# Patient Record
Sex: Female | Born: 1960 | Race: White | Hispanic: No | Marital: Married | Smoking: Former smoker
Health system: Southern US, Community
[De-identification: ages and names within clinical notes are randomized; demographics above are authoritative.]

## PROBLEM LIST (undated history)

## (undated) DIAGNOSIS — M069 Rheumatoid arthritis, unspecified: Secondary | ICD-10-CM

## (undated) DIAGNOSIS — K219 Gastro-esophageal reflux disease without esophagitis: Secondary | ICD-10-CM

## (undated) DIAGNOSIS — K589 Irritable bowel syndrome without diarrhea: Secondary | ICD-10-CM

## (undated) DIAGNOSIS — R42 Dizziness and giddiness: Secondary | ICD-10-CM

## (undated) DIAGNOSIS — IMO0001 Reserved for inherently not codable concepts without codable children: Secondary | ICD-10-CM

## (undated) DIAGNOSIS — M3500 Sicca syndrome, unspecified: Secondary | ICD-10-CM

## (undated) HISTORY — DX: Sjogren syndrome, unspecified: M35.00

## (undated) HISTORY — DX: Rheumatoid arthritis, unspecified: M06.9

---

## 1999-06-05 DIAGNOSIS — R42 Dizziness and giddiness: Secondary | ICD-10-CM

## 1999-06-05 HISTORY — DX: Dizziness and giddiness: R42

## 2000-06-04 HISTORY — PX: BLADDER SUSPENSION: SHX72

## 2014-12-31 ENCOUNTER — Encounter (HOSPITAL_BASED_OUTPATIENT_CLINIC_OR_DEPARTMENT_OTHER): Payer: Self-pay | Admitting: *Deleted

## 2014-12-31 ENCOUNTER — Emergency Department (HOSPITAL_BASED_OUTPATIENT_CLINIC_OR_DEPARTMENT_OTHER)
Admission: EM | Admit: 2014-12-31 | Discharge: 2014-12-31 | Disposition: A | Discharge status: Home / Self Care | Payer: BLUE CROSS/BLUE SHIELD | Attending: Emergency Medicine | Admitting: Emergency Medicine

## 2014-12-31 ENCOUNTER — Emergency Department (HOSPITAL_BASED_OUTPATIENT_CLINIC_OR_DEPARTMENT_OTHER): Payer: BLUE CROSS/BLUE SHIELD

## 2014-12-31 DIAGNOSIS — Z79899 Other long term (current) drug therapy: Secondary | ICD-10-CM | POA: Insufficient documentation

## 2014-12-31 DIAGNOSIS — K088 Other specified disorders of teeth and supporting structures: Secondary | ICD-10-CM | POA: Diagnosis not present

## 2014-12-31 DIAGNOSIS — K219 Gastro-esophageal reflux disease without esophagitis: Secondary | ICD-10-CM | POA: Diagnosis not present

## 2014-12-31 DIAGNOSIS — R1013 Epigastric pain: Secondary | ICD-10-CM | POA: Diagnosis not present

## 2014-12-31 DIAGNOSIS — Z87891 Personal history of nicotine dependence: Secondary | ICD-10-CM | POA: Insufficient documentation

## 2014-12-31 DIAGNOSIS — R140 Abdominal distension (gaseous): Secondary | ICD-10-CM | POA: Insufficient documentation

## 2014-12-31 DIAGNOSIS — Z7952 Long term (current) use of systemic steroids: Secondary | ICD-10-CM | POA: Diagnosis not present

## 2014-12-31 DIAGNOSIS — R0789 Other chest pain: Secondary | ICD-10-CM | POA: Diagnosis not present

## 2014-12-31 DIAGNOSIS — K589 Irritable bowel syndrome without diarrhea: Secondary | ICD-10-CM | POA: Insufficient documentation

## 2014-12-31 DIAGNOSIS — R079 Chest pain, unspecified: Secondary | ICD-10-CM | POA: Diagnosis present

## 2014-12-31 DIAGNOSIS — R51 Headache: Secondary | ICD-10-CM | POA: Diagnosis not present

## 2014-12-31 DIAGNOSIS — R519 Headache, unspecified: Secondary | ICD-10-CM

## 2014-12-31 HISTORY — DX: Dizziness and giddiness: R42

## 2014-12-31 HISTORY — DX: Reserved for inherently not codable concepts without codable children: IMO0001

## 2014-12-31 HISTORY — DX: Gastro-esophageal reflux disease without esophagitis: K21.9

## 2014-12-31 HISTORY — DX: Irritable bowel syndrome without diarrhea: K58.9

## 2014-12-31 LAB — CBC WITH DIFFERENTIAL/PLATELET
BASOS ABS: 0 10*3/uL (ref 0.0–0.1)
BASOS PCT: 1 % (ref 0–1)
Eosinophils Absolute: 0.1 10*3/uL (ref 0.0–0.7)
Eosinophils Relative: 1 % (ref 0–5)
HEMATOCRIT: 41.1 % (ref 36.0–46.0)
Hemoglobin: 13.7 g/dL (ref 12.0–15.0)
Lymphocytes Relative: 48 % — ABNORMAL HIGH (ref 12–46)
Lymphs Abs: 2.9 10*3/uL (ref 0.7–4.0)
MCH: 28.5 pg (ref 26.0–34.0)
MCHC: 33.3 g/dL (ref 30.0–36.0)
MCV: 85.4 fL (ref 78.0–100.0)
Monocytes Absolute: 0.4 10*3/uL (ref 0.1–1.0)
Monocytes Relative: 7 % (ref 3–12)
Neutro Abs: 2.6 10*3/uL (ref 1.7–7.7)
Neutrophils Relative %: 43 % (ref 43–77)
PLATELETS: 239 10*3/uL (ref 150–400)
RBC: 4.81 MIL/uL (ref 3.87–5.11)
RDW: 12.2 % (ref 11.5–15.5)
WBC: 6 10*3/uL (ref 4.0–10.5)

## 2014-12-31 LAB — TROPONIN I: Troponin I: <0.03 ng/mL (ref <0.031)

## 2014-12-31 LAB — COMPREHENSIVE METABOLIC PANEL
ALBUMIN: 4.1 g/dL (ref 3.5–5.0)
ALT: 40 U/L (ref 14–54)
ANION GAP: 7 (ref 5–15)
AST: 37 U/L (ref 15–41)
Alkaline Phosphatase: 78 U/L (ref 38–126)
BILIRUBIN TOTAL: 0.9 mg/dL (ref 0.3–1.2)
BUN: 14 mg/dL (ref 6–20)
CHLORIDE: 103 mmol/L (ref 101–111)
CO2: 26 mmol/L (ref 22–32)
Calcium: 9.1 mg/dL (ref 8.9–10.3)
Creatinine, Ser: 0.76 mg/dL (ref 0.44–1.00)
GFR calc Af Amer: >60 mL/min (ref >60)
GFR calc non Af Amer: >60 mL/min (ref >60)
Glucose, Bld: 97 mg/dL (ref 65–99)
Potassium: 3.7 mmol/L (ref 3.5–5.1)
SODIUM: 136 mmol/L (ref 135–145)
TOTAL PROTEIN: 7.2 g/dL (ref 6.5–8.1)

## 2014-12-31 MED ORDER — DIPHENHYDRAMINE HCL 50 MG/ML IJ SOLN
25.0000 mg | Freq: Once | INTRAMUSCULAR | Status: AC
  Administered 2014-12-31: 25 mg via INTRAVENOUS
  Filled 2014-12-31: qty 1

## 2014-12-31 MED ORDER — KETOROLAC TROMETHAMINE 30 MG/ML IJ SOLN
15.0000 mg | Freq: Once | INTRAMUSCULAR | Status: AC
  Administered 2014-12-31: 15 mg via INTRAVENOUS
  Filled 2014-12-31: qty 1

## 2014-12-31 MED ORDER — DIAZEPAM 5 MG/ML IJ SOLN
5.0000 mg | Freq: Once | INTRAMUSCULAR | Status: AC
  Administered 2014-12-31: 5 mg via INTRAVENOUS
  Filled 2014-12-31: qty 2

## 2014-12-31 MED ORDER — SODIUM CHLORIDE 0.9 % IV BOLUS (SEPSIS)
1000.0000 mL | Freq: Once | INTRAVENOUS | Status: AC
  Administered 2014-12-31: 1000 mL via INTRAVENOUS

## 2014-12-31 MED ORDER — PROCHLORPERAZINE EDISYLATE 5 MG/ML IJ SOLN
10.0000 mg | Freq: Once | INTRAMUSCULAR | Status: AC
  Administered 2014-12-31: 10 mg via INTRAVENOUS
  Filled 2014-12-31: qty 2

## 2014-12-31 NOTE — ED Notes (Signed)
Patient states she was doing laundry about 30 minutes PTA, when she developed a sudden chest pain in the central chest with pain her teeth.  States she has had vertigo this week, seen by her PCP and treated with a steroid injection and po meclizine.  Vertigo improved yesterday, and today had a small amount of dizziness.

## 2014-12-31 NOTE — ED Notes (Signed)
Patient transported to X-ray 

## 2014-12-31 NOTE — ED Provider Notes (Signed)
CSN: [id number]     Arrival date & time 12/31/14  1052 History   First MD Initiated Contact with Patient 12/31/14 1204     Chief Complaint  Patient presents with  . Chest Pain     (Consider location/radiation/quality/duration/timing/severity/associated sxs/prior Treatment) Patient is a 54 y.o. female presenting with chest pain. The history is provided by the patient and a relative.  Chest Pain Pain location:  Epigastric Pain quality: aching and pressure   Pain radiates to:  Does not radiate Pain radiates to the back: no   Pain severity:  Moderate Onset quality:  Sudden Duration:  20 minutes Timing:  Intermittent Progression:  Waxing and waning Chronicity:  New Relieved by: walking. Worsened by:  Nothing tried Ineffective treatments:  None tried Associated symptoms: no dizziness, no fever, no headache, no nausea, no palpitations, no shortness of breath and not vomiting   Risk factors: no coronary artery disease, no diabetes mellitus, no high cholesterol and no hypertension     Patient is a 54 y.o. female who presents with chest pain.  This started 2 hours ago.  The patient was hanging up laundry and felt this pressure about her chest. This is epigastric pain. Resolved on its own after about 20 minutes. Had recurrence about 30 minutes ago. Has completely resolved now is complaining of a right-sided temporal headache. This feels like her prior migraines. Denies nausea diaphoresis shortness of breath. Denies history of smoking hypertension hyperlipidemia. No noted family history. Denies cough however has been having chronic sinus issues for many years. Also complaining that the pain is in her teeth.  Past Medical History  Diagnosis Date  . IBS (irritable bowel syndrome)   . Reflux   . Vertigo 2001   Past Surgical History  Procedure Laterality Date  . Bladder suspension  2002   No family history on file. History  Substance Use Topics  . Smoking status: Former Games developer  .  Smokeless tobacco: Never Used  . Alcohol Use: Yes     Comment: occassionally   OB History    No data available     Review of Systems  Constitutional: Negative for fever and chills.  HENT: Negative for congestion and rhinorrhea.        Teeth  pain  Eyes: Negative for redness and visual disturbance.  Respiratory: Negative for shortness of breath and wheezing.   Cardiovascular: Positive for chest pain. Negative for palpitations.  Gastrointestinal: Negative for nausea and vomiting.  Genitourinary: Negative for dysuria and urgency.  Musculoskeletal: Negative for myalgias and arthralgias.  Skin: Negative for pallor and wound.  Neurological: Negative for dizziness and headaches.      Allergies  Levaquin  Home Medications   Prior to Admission medications   Medication Sig Start Date End Date Taking? Authorizing Provider  butalbital-acetaminophen-caffeine (FIORICET WITH CODEINE) 50-325-40-30 MG per capsule Take 1 capsule by mouth every 4 (four) hours as needed for headache.   Yes Historical Provider, MD  DICYCLOMINE HCL PO Take by mouth.   Yes Historical Provider, MD  Esomeprazole Magnesium (NEXIUM PO) Take by mouth.   Yes Historical Provider, MD  GABAPENTIN, ONCE-DAILY, PO Take by mouth.   Yes Historical Provider, MD  MECLIZINE HCL PO Take by mouth.   Yes Historical Provider, MD  metaxalone (SKELAXIN) 800 MG tablet Take 800 mg by mouth 3 (three) times daily.   Yes Historical Provider, MD  ONDANSETRON HCL PO Take by mouth.   Yes Historical Provider, MD  triamcinolone cream (KENALOG) 0.1 % Apply  1 application topically 2 (two) times daily.   Yes Historical Provider, MD  VALACYCLOVIR HCL PO Take by mouth.   Yes Historical Provider, MD   BP 123/66 mmHg  Pulse 68  Temp(Src) 97.9 F (36.6 C) (Oral)  Resp 16  Ht 5\' 7"  (1.702 m)  Wt 159 lb (72.122 kg)  BMI 24.90 kg/m2  SpO2 99% Physical Exam  Constitutional: She is oriented to person, place, and time. She appears well-developed and  well-nourished. No distress.  HENT:  Head: Normocephalic and atraumatic.  Eyes: EOM are normal. Pupils are equal, round, and reactive to light.  Neck: Normal range of motion. Neck supple.  Cardiovascular: Normal rate and regular rhythm.  Exam reveals no gallop and no friction rub.   No murmur heard. Pulmonary/Chest: Effort normal. She has no wheezes. She has no rales. She exhibits tenderness (TTP about the sternum).  Abdominal: Soft. She exhibits distension (epigastric). There is no tenderness. There is no rebound.  Musculoskeletal: She exhibits no edema or tenderness.  Neurological: She is alert and oriented to person, place, and time.  Skin: Skin is warm and dry. She is not diaphoretic.  Psychiatric: She has a normal mood and affect. Her behavior is normal.    ED Course  Procedures (including critical care time) Labs Review Labs Reviewed  CBC WITH DIFFERENTIAL/PLATELET - Abnormal; Notable for the following:    Lymphocytes Relative 48 (*)    All other components within normal limits  COMPREHENSIVE METABOLIC PANEL  TROPONIN I  TROPONIN I    Imaging Review Dg Chest 2 View  12/31/2014   CLINICAL DATA:  Acute chest pain.  EXAM: CHEST  2 VIEW  COMPARISON:  None.  FINDINGS: The heart size and mediastinal contours are within normal limits. Both lungs are clear. No pneumothorax or pleural effusion is noted. The visualized skeletal structures are unremarkable.  IMPRESSION: No active cardiopulmonary disease.   Electronically Signed   By: Lupita Raider, M.D.   On: 12/31/2014 11:44     EKG Interpretation None      MDM   Final diagnoses:  Chest pain, unspecified chest pain type  Temporal headache    54 yo F with a chief complaint of chest pain. This is now resolved and now she's complaining of a right-sided headache. No history of hypertension feel that an aortic dissection is unlikely. Headache is been ongoing for the past couple weeks unlikely to be subarachnoid hemorrhage. We'll  treat with a migraine cocktail. Delta troponin. Patient low risk for cardiac chest pain. HEAR 1.  PERC Negative.  Delta troponin negative. Patient's headache completely resolved with migraine cocktail. No continued vertiginous symptoms. Will have follow-up with their PCP.   I have discussed the diagnosis/risks/treatment options with the patient and family and believe the pt to be eligible for discharge home to follow-up with PCP. We also discussed returning to the ED immediately if new or worsening sx occur. We discussed the sx which are most concerning (e.g., sudden worsening pain, syncope, sob) that necessitate immediate return. Medications administered to the patient during their visit and any new prescriptions provided to the patient are listed below.  Medications given during this visit Medications  prochlorperazine (COMPAZINE) injection 10 mg (10 mg Intravenous Given 12/31/14 1259)  diphenhydrAMINE (BENADRYL) injection 25 mg (25 mg Intravenous Given 12/31/14 1248)  diazepam (VALIUM) injection 5 mg (5 mg Intravenous Given 12/31/14 1256)  sodium chloride 0.9 % bolus 1,000 mL (0 mLs Intravenous Stopped 12/31/14 1322)  ketorolac (TORADOL) 30 MG/ML  injection 15 mg (15 mg Intravenous Given 12/31/14 1252)    Discharge Medication List as of 12/31/2014  3:21 PM       The patient appears reasonably screen and/or stabilized for discharge and I doubt any other medical condition or other [facility] requiring further screening, evaluation, or treatment in the ED at this time prior to discharge.      Melene Plan, DO 12/31/14 2119

## 2014-12-31 NOTE — Discharge Instructions (Signed)

## 2015-11-28 IMAGING — CR DG CHEST 2V
2 series · 2 of 2 positions shown · non-contrast
Comparison: None.

CLINICAL DATA: Acute chest pain.

EXAM:
CHEST  2 VIEW

[w chest pa]
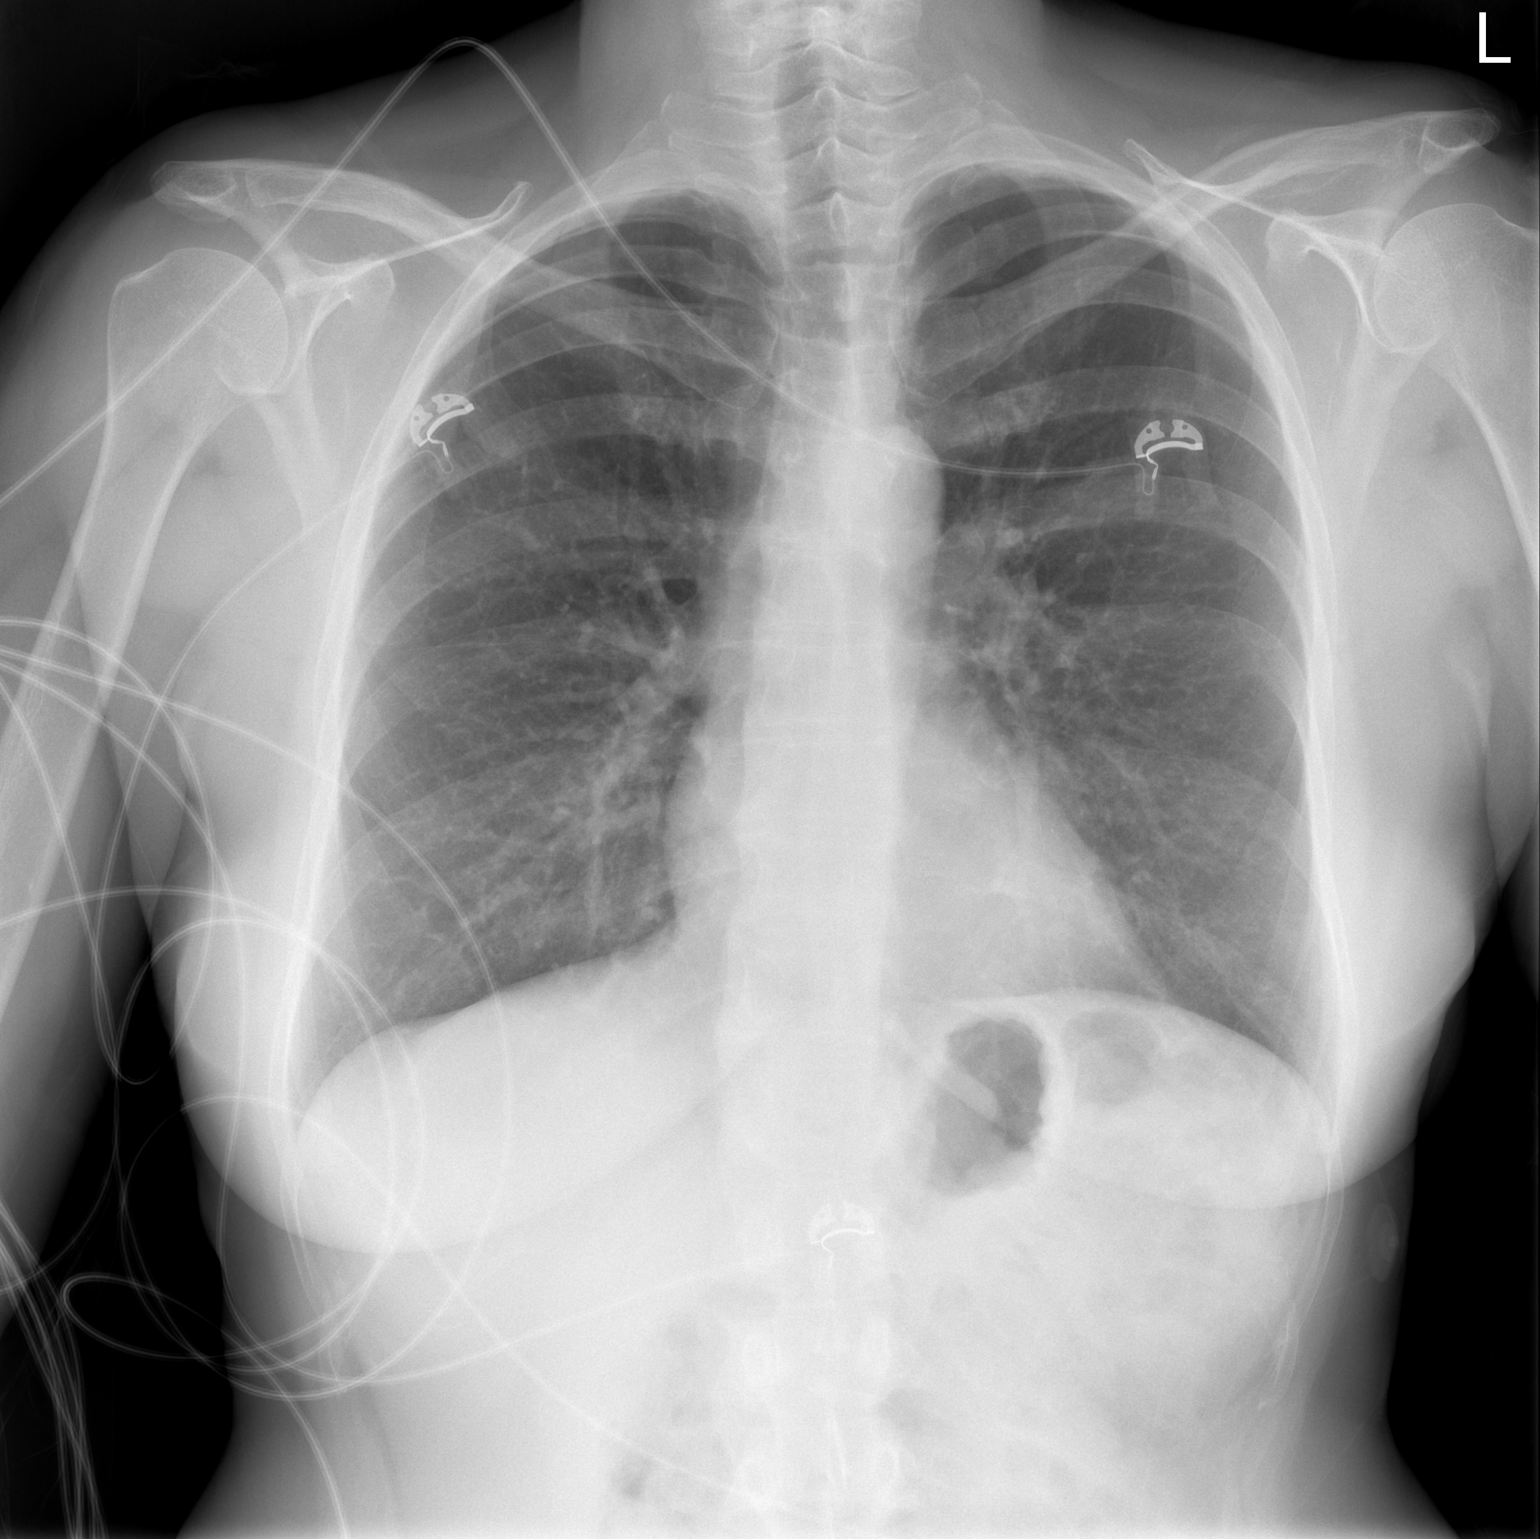

[w chest lat]
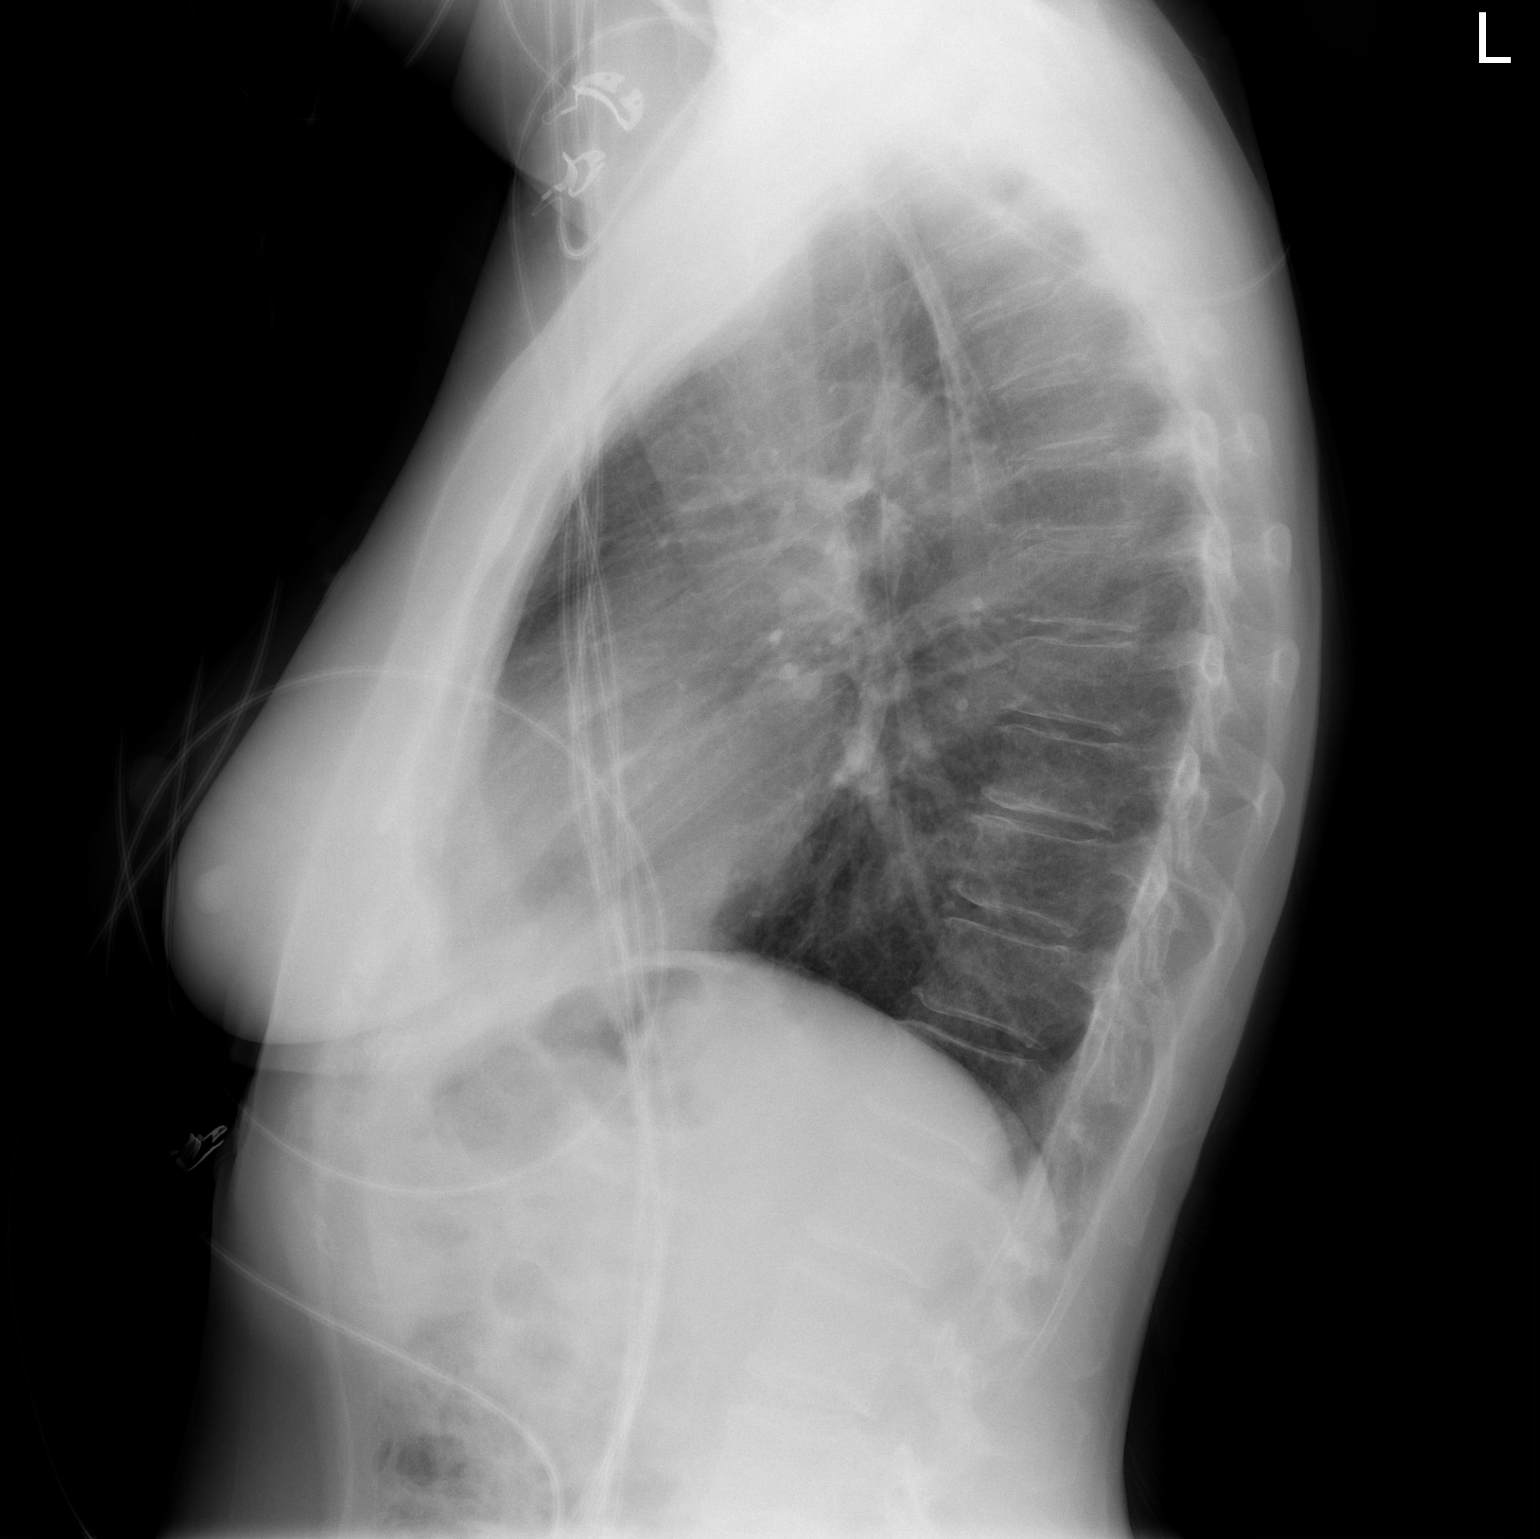

[2 of 2 positions shown; findings below may reference images not displayed]

FINDINGS: The heart size and mediastinal contours are within normal limits.
Both lungs are clear. No pneumothorax or pleural effusion is noted.
The visualized skeletal structures are unremarkable.
IMPRESSION: No active cardiopulmonary disease.

## 2017-05-30 ENCOUNTER — Other Ambulatory Visit: Payer: Self-pay | Admitting: Physician Assistant

## 2017-05-30 DIAGNOSIS — M810 Age-related osteoporosis without current pathological fracture: Principal | ICD-10-CM

## 2017-05-30 DIAGNOSIS — Z780 Asymptomatic menopausal state: Secondary | ICD-10-CM

## 2017-05-30 DIAGNOSIS — Z1231 Encounter for screening mammogram for malignant neoplasm of breast: Secondary | ICD-10-CM

## 2017-07-26 ENCOUNTER — Encounter: Payer: Self-pay | Admitting: Pulmonary Disease

## 2017-07-26 ENCOUNTER — Ambulatory Visit: Payer: Managed Care, Other (non HMO) | Admitting: Pulmonary Disease

## 2017-07-26 ENCOUNTER — Ambulatory Visit (INDEPENDENT_AMBULATORY_CARE_PROVIDER_SITE_OTHER)
Admission: RE | Admit: 2017-07-26 | Discharge: 2017-07-26 | Disposition: A | Discharge status: Home / Self Care | Payer: Managed Care, Other (non HMO) | Source: Ambulatory Visit | Attending: Pulmonary Disease | Admitting: Pulmonary Disease

## 2017-07-26 VITALS — BP 130/82 | HR 87 | Ht 67.0 in | Wt 159.0 lb

## 2017-07-26 DIAGNOSIS — R05 Cough: Secondary | ICD-10-CM | POA: Diagnosis not present

## 2017-07-26 DIAGNOSIS — R059 Cough, unspecified: Secondary | ICD-10-CM

## 2017-07-26 LAB — NITRIC OXIDE: Nitric Oxide: 8

## 2017-07-26 MED ORDER — OMEPRAZOLE 20 MG PO CPDR: 20.0000 mg | DELAYED_RELEASE_CAPSULE | Freq: Every day | ORAL | 2 refills | Status: AC

## 2017-07-26 MED ORDER — CHLORPHENIRAMINE MALEATE 4 MG PO TABS: 8.0000 mg | ORAL_TABLET | Freq: Three times a day (TID) | ORAL | 0 refills | Status: AC

## 2017-07-26 MED ORDER — AZELASTINE-FLUTICASONE 137-50 MCG/ACT NA SUSP: 2.0000 | Freq: Two times a day (BID) | NASAL | 0 refills | Status: DC

## 2017-07-26 MED ORDER — PANTOPRAZOLE SODIUM 40 MG PO TBEC: 40.0000 mg | DELAYED_RELEASE_TABLET | Freq: Two times a day (BID) | ORAL | 1 refills | Status: DC

## 2017-07-26 MED ORDER — OMEPRAZOLE 20 MG PO CPDR: 20.0000 mg | DELAYED_RELEASE_CAPSULE | Freq: Every day | ORAL | 11 refills | Status: DC

## 2017-07-26 NOTE — Progress Notes (Signed)
Melissa Mitchell    [id number]    01/13/1961  Primary Care Physician:Beal, Melissa SeeSheri, Mitchell  Referring Physician: Ladora DanielBeal, Melissa Mitchell [address] [place], [place] [id number]  Chief complaint: Consult for cough  HPI: 57 year old with history of Sjogren's disease, rheumatoid arthritis, chronic headaches, allergies.   Had an upper respiratory tract infection in December which was treated with antibiotics and prednisone by primary care physician.  She reports that the symptoms have improved but she has persistent nonproductive cough.  Cough occurs mostly at night, when lying down.  Denies any dyspnea, sputum production, wheezing, fevers, chills.   She has history of Sjogren's and recently diagnosed with rheumatoid arthritis.  [facility].  She had been on Plaquenil but had to stop it due to vision problems.   Pets: Has a dog.  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  No recent exposure.  No hot tub Smoking history: Minimal smoking in her 20s. Travel History: Not significant  Outpatient Encounter Medications as of 07/26/2017  Medication Sig  . azelastine (ASTELIN) 0.1 % nasal spray Place 1 spray into both nostrils 2 (two) times daily. Use in each nostril as directed  . butalbital-acetaminophen-caffeine (FIORICET WITH CODEINE) 50-325-40-30 MG per capsule Take 1 capsule by mouth every 4 (four) hours as needed for headache.  . Cholecalciferol (VITAMIN D3) 5000 units CAPS Take 1 capsule by mouth daily.  . cycloSPORINE (RESTASIS) 0.05 % ophthalmic emulsion 1 drop 2 (two) times daily.  Marland Kitchen. dicyclomine (BENTYL) 10 MG capsule Take 10 mg by mouth 4 (four) times daily -  before meals and at bedtime.  . Ergocalciferol (VITAMIN D2 PO) Take by mouth.  Marland Kitchen. GABAPENTIN, ONCE-DAILY, PO Take by mouth.  . Glucosamine-Chondroit-Vit C-Mn (GLUCOSAMINE 1500 COMPLEX PO) Take by mouth 2 (two)  times daily.  . Glutamine 500 MG CAPS Take by mouth 2 (two) times daily.  Marland Kitchen. guaiFENesin (MUCINEX) 600 MG 12 hr tablet Take by mouth 2 (two) times daily.  Marland Kitchen. LYSINE PO Take 1 tablet by mouth daily.  . meclizine (ANTIVERT) 25 MG tablet Take 25 mg by mouth 3 (three) times daily as needed for dizziness.  . metaxalone (SKELAXIN) 800 MG tablet Take 800 mg by mouth 3 (three) times daily.  . Multiple Vitamin (MULTIVITAMIN) tablet Take 1 tablet by mouth daily.  . Omega-3 Fatty Acids (FISH OIL) 1000 MG CAPS Take by mouth.  . ondansetron (ZOFRAN) 8 MG tablet Take 8 mg by mouth every 8 (eight) hours as needed for nausea or vomiting.  . Probiotic Product (PROBIOTIC PO) Take by mouth.  Marland Kitchen. PROBIOTIC PRODUCT PO Take by mouth.  . S-Adenosylmethionine (SAM-E PO) Take 500 mg by mouth 2 (two) times daily.  Marland Kitchen. triamcinolone cream (KENALOG) 0.1 % Apply 1 application topically 2 (two) times daily.  . valACYclovir (VALTREX) 1000 MG tablet Take 1,000 mg by mouth 2 (two) times daily.  . [DISCONTINUED] DICYCLOMINE HCL PO Take by mouth.  . [DISCONTINUED] Esomeprazole Magnesium (NEXIUM PO) Take by mouth.  . [DISCONTINUED] metaxalone (SKELAXIN) 800 MG tablet Take 800 mg by mouth 3 (three) times daily.  . [DISCONTINUED] ONDANSETRON HCL PO Take by mouth.  . [DISCONTINUED] triamcinolone cream (KENALOG) 0.1 % Apply 1 application topically 2 (two) times daily.  . [DISCONTINUED] VALACYCLOVIR HCL PO Take by mouth.  . pantoprazole (PROTONIX) 40 MG tablet Take 1 tablet (40 mg total) by  mouth 2 (two) times daily.  . [DISCONTINUED] MECLIZINE HCL PO Take by mouth.   No facility-administered encounter medications on file as of 07/26/2017.     Allergies as of 07/26/2017 - Review Complete 07/26/2017  Allergen Reaction Noted  . Levaquin [levofloxacin in d5w] Nausea And Vomiting 12/31/2014    Past Medical History:  Diagnosis Date  . IBS (irritable bowel syndrome)   . RA (rheumatoid arthritis) (HCC)   . Reflux   . Sjogren's disease  (HCC)   . Vertigo 2001    Past Surgical History:  Procedure Laterality Date  . BLADDER SUSPENSION  2002    Family History  Problem Relation Age of Onset  . Arthritis/Rheumatoid Mother   . Throat cancer Brother   . Tuberculosis Paternal Grandfather     Social History   Socioeconomic History  . Marital status: Single    Spouse name: Not on file  . Number of children: Not on file  . Years of education: Not on file  . Highest education level: Not on file  Social Needs  . Financial resource strain: Not on file  . Food insecurity - worry: Not on file  . Food insecurity - inability: Not on file  . Transportation needs - medical: Not on file  . Transportation needs - non-medical: Not on file  Occupational History  . Not on file  Tobacco Use  . Smoking status: Former Smoker    Packs/day: 0.25    Years: 2.00    Pack years: 0.50    Types: Cigarettes  . Smokeless tobacco: Never Used  . Tobacco comment: quit at age 27  Substance and Sexual Activity  . Alcohol use: Yes    Comment: occassionally  . Drug use: No  . Sexual activity: Not on file  Other Topics Concern  . Not on file  Social History Narrative  . Not on file    Review of systems: Review of Systems  Constitutional: Negative for fever and chills.  HENT: Negative.   Eyes: Negative for blurred vision.  Respiratory: as per HPI  Cardiovascular: Negative for chest pain and palpitations.  Gastrointestinal: Negative for vomiting, diarrhea, blood per rectum. Genitourinary: Negative for dysuria, urgency, frequency and hematuria.  Musculoskeletal: Negative for myalgias, back pain and joint pain.  Skin: Negative for itching and rash.  Neurological: Negative for dizziness, tremors, focal weakness, seizures and loss of consciousness.  Endo/Heme/Allergies: Negative for environmental allergies.  Psychiatric/Behavioral: Negative for depression, suicidal ideas and hallucinations.  All other systems reviewed and are  negative.  Physical Exam: Blood pressure 130/82, pulse 87, height 5\' 7"  (1.702 m), weight 159 lb (72.1 kg), SpO2 99 %. Gen:      No acute distress HEENT:  EOMI, sclera anicteric Neck:     No masses; no thyromegaly Lungs:    Clear to auscultation bilaterally; normal respiratory effort CV:         Regular rate and rhythm; no murmurs Abd:      + bowel sounds; soft, non-tender; no palpable masses, no distension Ext:    No edema; adequate peripheral perfusion Skin:      Warm and dry; no rash Neuro: alert and oriented x 3 Psych: normal mood and affect  Data Reviewed: Chest x-ray 12/31/14-no active cardiopulmonary disease. FENO 07/26/17-8  Assessment:  Elevation for chronic cough Likely postinfectious from recent bronchitis.  She has postnasal drip and probably silent reflux is contributing to ongoing symptoms Start chlorpheniramine antihistamine 3 times daily and Dymista nasal spray for postnasal drip  Start empiric treatment with Prilosec for GERD  She has history of Sjogren's and rheumatoid arthritis.  Will get chest x-ray to make sure there is no lung involvement.  She may need CT if symptoms persist or if x-ray is abnormal.  Plan/Recommendations: - Chlorpheniramine, Dymista - Prilosec - Chest x-ray  Chilton Greathouse MD [facility] Pager [phone] 07/26/2017, 9:31 AM  CC: Melissa Daniel, Mitchell

## 2017-07-26 NOTE — Patient Instructions (Addendum)
We will start on chlorpheniramine 8 mg 3 times daily and Dymista nasal spray for 2 weeks Start Prilosec 20 once daily for 2 weeks We will get a chest x-ray to make sure there is no lung abnormalities or lung involvement of your connective tissue disease Follow-up in 1 month.

## 2017-07-31 ENCOUNTER — Telehealth: Payer: Self-pay | Admitting: Pulmonary Disease

## 2017-07-31 MED ORDER — AZELASTINE HCL 0.1 % NA SOLN: 1.0000 | Freq: Two times a day (BID) | NASAL | 5 refills | Status: DC

## 2017-07-31 MED ORDER — FLUTICASONE PROPIONATE 50 MCG/ACT NA SUSP: 1.0000 | Freq: Every day | NASAL | 5 refills | Status: AC

## 2017-07-31 NOTE — Telephone Encounter (Signed)
Called and spoke with pt letting her know the results of her cxr.  Pt expressed understanding. Nothing further needed at this current time.

## 2017-07-31 NOTE — Telephone Encounter (Signed)
order Azelastine and Fluticasone separately

## 2017-07-31 NOTE — Telephone Encounter (Signed)
[facility] in regards to Dymista stating patient's plan does not cover this medication.  Dr. Isaiah SergeMannam please advise, would you like us to start PA or order Azelastine and Fluticasone separately?   Routing to Dr. Isaiah SergeMannam and Delray AltMargie for follow up

## 2017-07-31 NOTE — Progress Notes (Signed)
LMTCB on preferred phone number listed for patient. x2

## 2017-07-31 NOTE — Telephone Encounter (Signed)
Sent Rx of fluticasone to pt's pharmacy as well as another Rx of azelastine to pt's pharmacy and discontinued the dymista off of pt's med list due to it not being covered by pt's insurance.  Called pt and left a detailed message stating that the dymista was not going to be covered by pt's insurance and that we were having her do the azestaline as well as the fluticasone instead of the dymista.  Nothing further needed at this current time.

## 2017-08-26 ENCOUNTER — Ambulatory Visit: Payer: Managed Care, Other (non HMO) | Admitting: Pulmonary Disease

## 2017-08-29 ENCOUNTER — Ambulatory Visit: Payer: Managed Care, Other (non HMO) | Admitting: Pulmonary Disease

## 2017-08-29 ENCOUNTER — Encounter: Payer: Self-pay | Admitting: Pulmonary Disease

## 2017-08-29 VITALS — BP 128/74 | HR 62 | Ht 66.0 in | Wt 159.0 lb

## 2017-08-29 DIAGNOSIS — R05 Cough: Secondary | ICD-10-CM | POA: Diagnosis not present

## 2017-08-29 DIAGNOSIS — R059 Cough, unspecified: Secondary | ICD-10-CM

## 2017-08-29 NOTE — Progress Notes (Signed)
Melissa Mitchell    [id number]    09/18/1960  Primary Care Physician:Beal, Christa SeeSheri, [name]  Referring Physician: Ladora DanielBeal, [name], [name] [address] [place], [place] [id number]  Chief complaint: Follow up for upper airway cough  HPI: 57 year old with history of Sjogren's disease, rheumatoid arthritis, chronic headaches, allergies.   Had an upper respiratory tract infection in December which was treated with antibiotics and prednisone by primary care physician.  She reports that the symptoms have improved but she has persistent nonproductive cough.  Cough occurs mostly at night, when lying down.  Denies any dyspnea, sputum production, wheezing, fevers, chills.   She has history of Sjogren's and recently diagnosed with rheumatoid arthritis.  [facility]  She had been on Plaquenil but had to stop it due to vision problems.   Pets: Has a dog.  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  No recent exposure.  No hot tub Smoking history: Minimal smoking in her 20s. Travel History: Not significant  Interim history: States that the cough has almost completely resolved.  She stopped using the chlorpheniramine but is still using the Flonase and Astelin spray and Prilosec  Outpatient Encounter Medications as of 08/29/2017  Medication Sig  . butalbital-acetaminophen-caffeine (FIORICET WITH CODEINE) 50-325-40-30 MG per capsule Take 1 capsule by mouth every 4 (four) hours as needed for headache.  . chlorpheniramine (CHLOR-TRIMETON) 4 MG tablet Take 2 tablets (8 mg total) by mouth 3 (three) times daily.  . Cholecalciferol (VITAMIN D3) 5000 units CAPS Take 1 capsule by mouth daily.  . cycloSPORINE (RESTASIS) 0.05 % ophthalmic emulsion 1 drop 2 (two) times daily.  Marland Kitchen. dicyclomine (BENTYL) 10 MG capsule Take 10 mg by mouth 4 (four) times daily -  before meals and at bedtime.  .  fluticasone (FLONASE) 50 MCG/ACT nasal spray Place 1 spray into both nostrils daily.  Marland Kitchen. GABAPENTIN, ONCE-DAILY, PO Take by mouth.  . Glucosamine-Chondroit-Vit C-Mn (GLUCOSAMINE 1500 COMPLEX PO) Take by mouth 2 (two) times daily.  . Glutamine 500 MG CAPS Take by mouth 2 (two) times daily.  Marland Kitchen. guaiFENesin (MUCINEX) 600 MG 12 hr tablet Take by mouth 2 (two) times daily.  Marland Kitchen. LYSINE PO Take 1 tablet by mouth daily.  . meclizine (ANTIVERT) 25 MG tablet Take 25 mg by mouth 3 (three) times daily as needed for dizziness.  . metaxalone (SKELAXIN) 800 MG tablet Take 800 mg by mouth 3 (three) times daily.  . Multiple Vitamin (MULTIVITAMIN) tablet Take 1 tablet by mouth daily.  . Omega-3 Fatty Acids (FISH OIL) 1000 MG CAPS Take by mouth.  Marland Kitchen. omeprazole (PRILOSEC) 20 MG capsule Take 1 capsule (20 mg total) by mouth daily.  . ondansetron (ZOFRAN) 8 MG tablet Take 8 mg by mouth every 8 (eight) hours as needed for nausea or vomiting.  . Probiotic Product (PROBIOTIC PO) Take by mouth.  . S-Adenosylmethionine (SAM-E PO) Take 500 mg by mouth 2 (two) times daily.  Marland Kitchen. triamcinolone cream (KENALOG) 0.1 % Apply 1 application topically 2 (two) times daily.  . valACYclovir (VALTREX) 1000 MG tablet Take 1,000 mg by mouth 2 (two) times daily.  . [DISCONTINUED] azelastine (ASTELIN) 0.1 % nasal spray Place 1 spray into both nostrils 2 (two) times daily. Use in each nostril as directed  . [DISCONTINUED] Ergocalciferol (VITAMIN D2 PO) Take by mouth.  . [DISCONTINUED] omeprazole (PRILOSEC) 20 MG capsule Take  1 capsule (20 mg total) by mouth daily.  . [DISCONTINUED] PROBIOTIC PRODUCT PO Take by mouth.   No facility-administered encounter medications on file as of 08/29/2017.     Allergies as of 08/29/2017 - Review Complete 08/29/2017  Allergen Reaction Noted  . Levaquin [levofloxacin in d5w] Nausea And Vomiting 12/31/2014    Past Medical History:  Diagnosis Date  . IBS (irritable bowel syndrome)   . RA (rheumatoid  arthritis) (HCC)   . Reflux   . Sjogren's disease (HCC)   . Vertigo 2001    Past Surgical History:  Procedure Laterality Date  . BLADDER SUSPENSION  2002    Family History  Problem Relation Age of Onset  . Arthritis/Rheumatoid Mother   . Throat cancer Brother   . Tuberculosis Paternal Grandfather     Social History   Socioeconomic History  . Marital status: Married    Spouse name: Not on file  . Number of children: Not on file  . Years of education: Not on file  . Highest education level: Not on file  Occupational History  . Not on file  Social Needs  . Financial resource strain: Not on file  . Food insecurity:    Worry: Not on file    Inability: Not on file  . Transportation needs:    Medical: Not on file    Non-medical: Not on file  Tobacco Use  . Smoking status: Former Smoker    Packs/day: 0.25    Years: 2.00    Pack years: 0.50    Types: Cigarettes  . Smokeless tobacco: Never Used  . Tobacco comment: quit at age 27  Substance and Sexual Activity  . Alcohol use: Yes    Comment: occassionally  . Drug use: No  . Sexual activity: Not on file  Lifestyle  . Physical activity:    Days per week: Not on file    Minutes per session: Not on file  . Stress: Not on file  Relationships  . Social connections:    Talks on phone: Not on file    Gets together: Not on file    Attends religious service: Not on file    Active member of club or organization: Not on file    Attends meetings of clubs or organizations: Not on file    Relationship status: Not on file  . Intimate partner violence:    Fear of current or ex partner: Not on file    Emotionally abused: Not on file    Physically abused: Not on file    Forced sexual activity: Not on file  Other Topics Concern  . Not on file  Social History Narrative  . Not on file    Review of systems: Review of Systems  Constitutional: Negative for fever and chills.  HENT: Negative.   Eyes: Negative for blurred  vision.  Respiratory: as per HPI  Cardiovascular: Negative for chest pain and palpitations.  Gastrointestinal: Negative for vomiting, diarrhea, blood per rectum. Genitourinary: Negative for dysuria, urgency, frequency and hematuria.  Musculoskeletal: Negative for myalgias, back pain and joint pain.  Skin: Negative for itching and rash.  Neurological: Negative for dizziness, tremors, focal weakness, seizures and loss of consciousness.  Endo/Heme/Allergies: Negative for environmental allergies.  Psychiatric/Behavioral: Negative for depression, suicidal ideas and hallucinations.  All other systems reviewed and are negative.  Physical Exam: Blood pressure 128/74, pulse 62, height 5\' 6"  (1.676 m), weight 159 lb (72.1 kg), SpO2 99 %. Gen:      No  acute distress HEENT:  EOMI, sclera anicteric Neck:     No masses; no thyromegaly Lungs:    Clear to auscultation bilaterally; normal respiratory effort CV:         Regular rate and rhythm; no murmurs Abd:      + bowel sounds; soft, non-tender; no palpable masses, no distension Ext:    No edema; adequate peripheral perfusion Skin:      Warm and dry; no rash Neuro: alert and oriented x 3 Psych: normal mood and affect  Data Reviewed: Chest x-ray 12/31/14-no active cardiopulmonary disease. FENO 07/26/17-8  CXR 07/26/17-no acute cardiopulmonary abnormality.  I have reviewed the images personally.  Assessment:  Chronic cough Likely postinfectious from recent bronchitis.  She has postnasal drip and probably silent reflux is contributing to ongoing symptoms Symptoms improved since last visit with chlorpheniramine, nasal spray and Prilosec Of instructed to use the regimen as needed going forward  She has history of Sjogren's and rheumatoid arthritis.  Chest x-ray shows clear lungs with no evidence of lung involvement.  Plan/Recommendations: - Chlorpheniramine, dymista, prilosec PRN - Return to clinic as needed  Chilton Greathouse MD [facility] Pager [phone] 08/29/2017, 1:51 PM  CC: Ladora Daniel, [name]

## 2017-08-29 NOTE — Patient Instructions (Signed)
I am glad you are feeling better You can use chlorpheniramine, nasal spray and Prilosec as needed if the cough recurs Please call us back if you need to be seen in the pulmonary clinic otherwise you can follow-up with your regular primary care physician.

## 2018-06-23 IMAGING — DX DG CHEST 2V
2 series · 2 of 2 positions shown · non-contrast
Comparison: 12/31/2014.

CLINICAL DATA: Cough.

EXAM:
CHEST  2 VIEW

[chest pa]
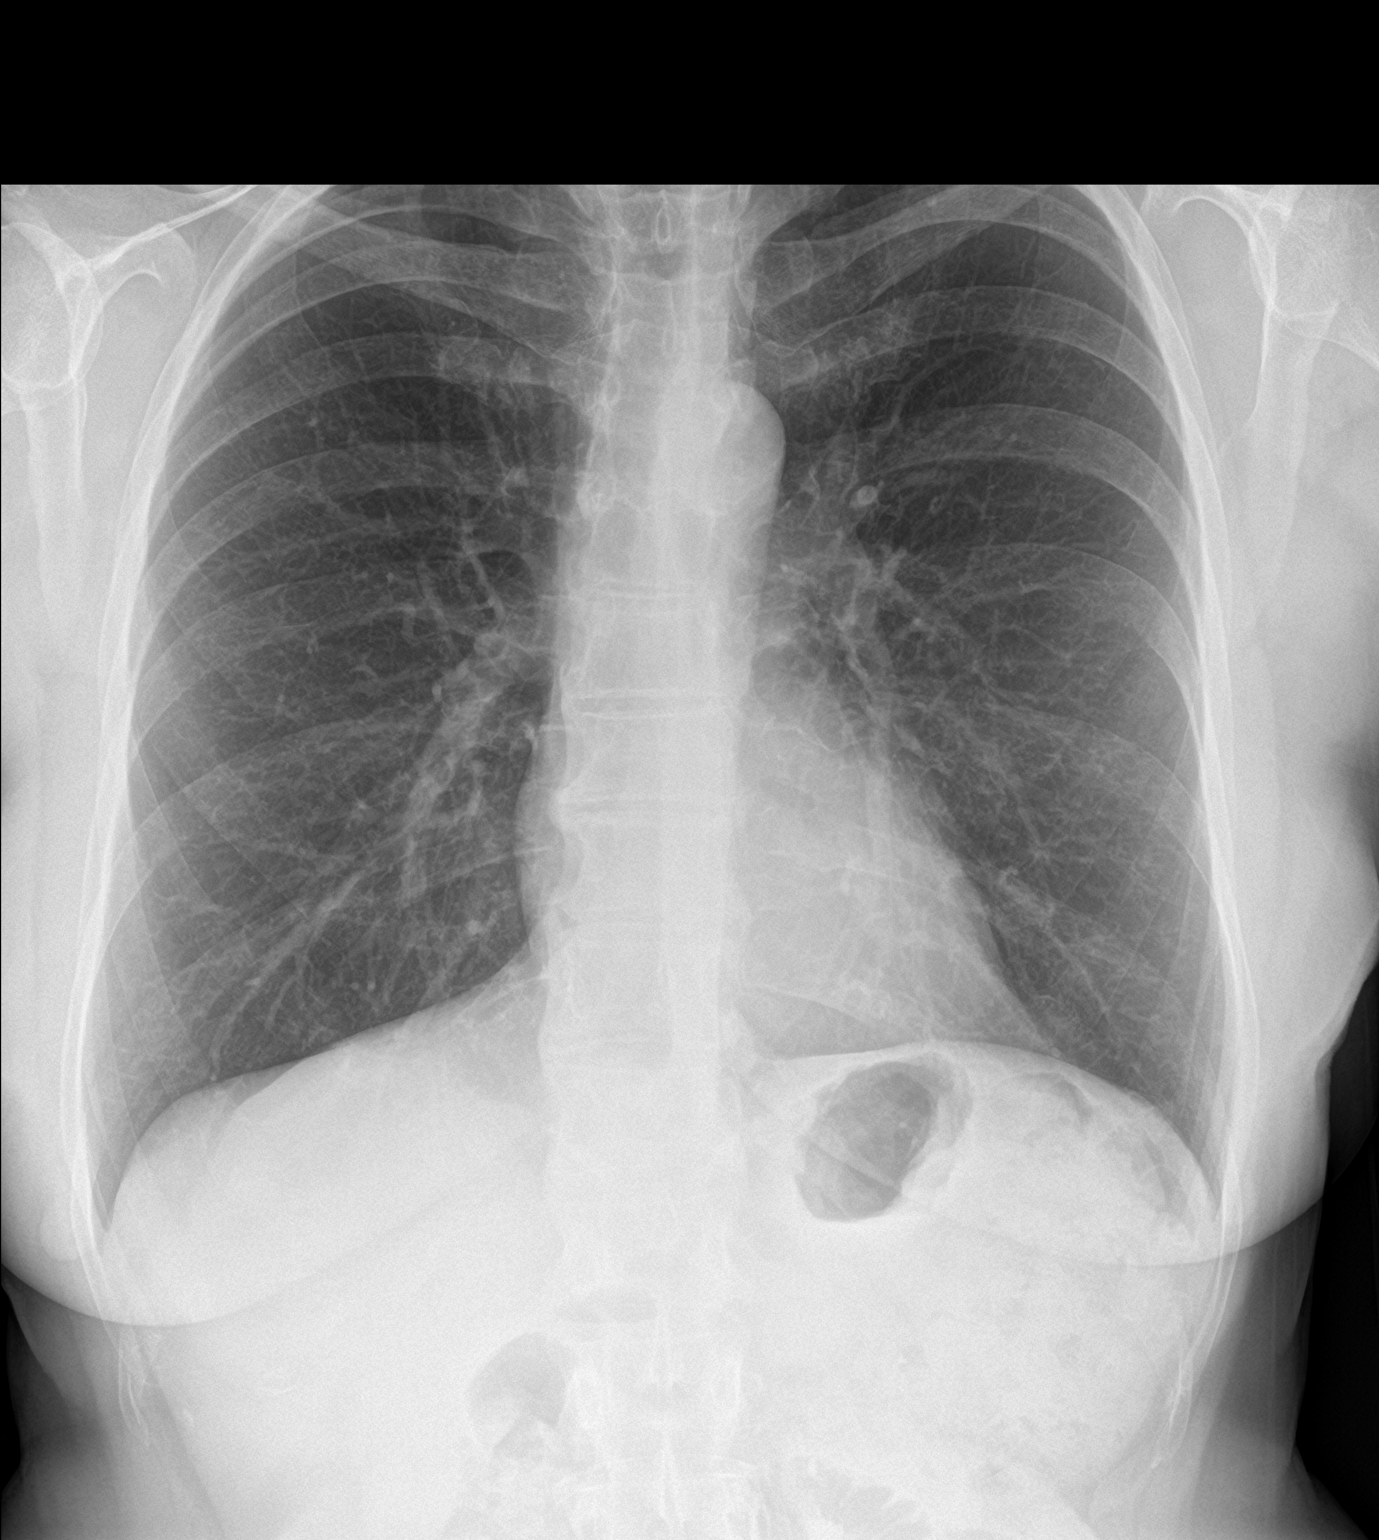

[chest lat]
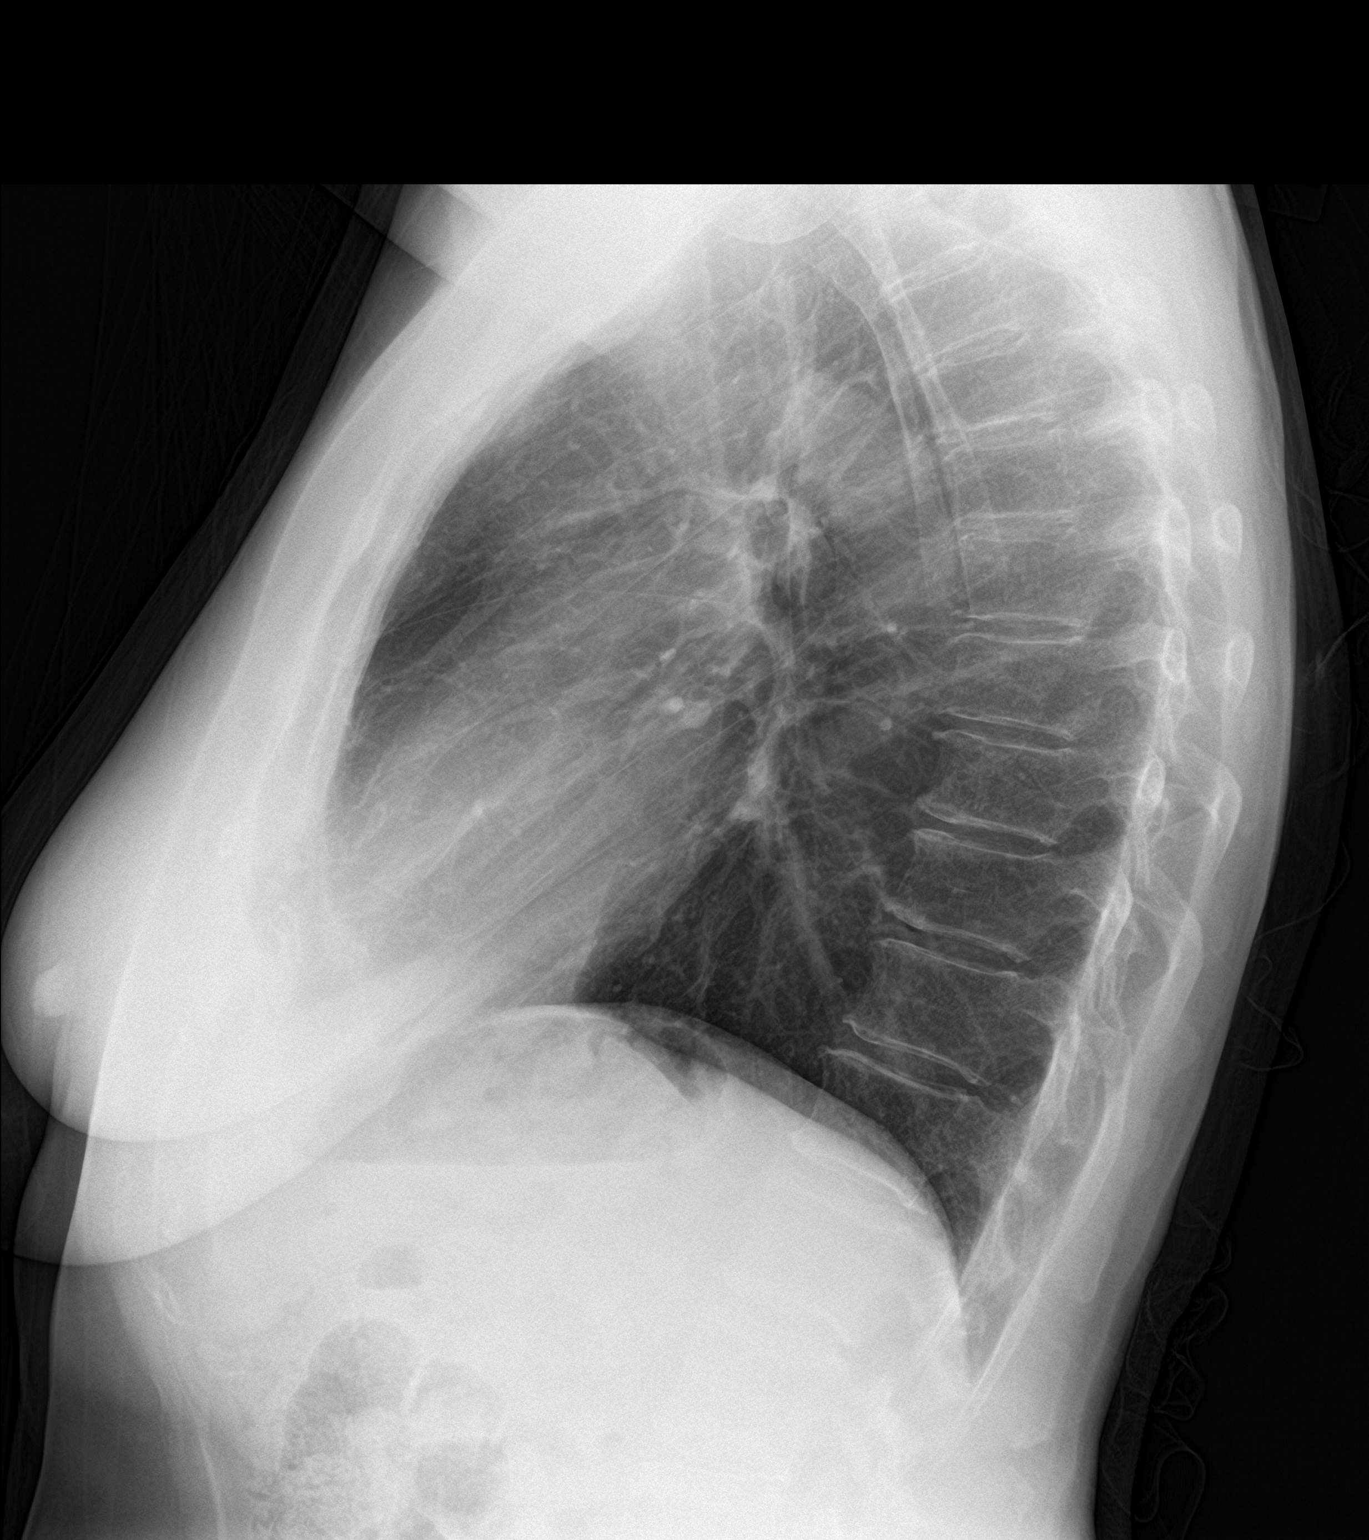

[2 of 2 positions shown; findings below may reference images not displayed]

FINDINGS: Mediastinum hilar structures normal. Lungs are clear. No pleural
effusion or pneumothorax. Heart size normal. Degenerative changes
thoracic spine.
IMPRESSION: No acute cardiopulmonary disease.
